# Patient Record
Sex: Male | Born: 1975
Health system: Southern US, Community
[De-identification: ages and names within clinical notes are randomized; demographics above are authoritative.]

## PROBLEM LIST (undated history)

## (undated) DIAGNOSIS — N200 Calculus of kidney: Secondary | ICD-10-CM

## (undated) DIAGNOSIS — K219 Gastro-esophageal reflux disease without esophagitis: Secondary | ICD-10-CM

## (undated) HISTORY — PX: HERNIA REPAIR: SHX51

## (undated) HISTORY — PX: TONSILLECTOMY: SUR1361

---

## 2016-06-21 DIAGNOSIS — J0100 Acute maxillary sinusitis, unspecified: Secondary | ICD-10-CM | POA: Diagnosis not present

## 2017-01-07 DIAGNOSIS — L739 Follicular disorder, unspecified: Secondary | ICD-10-CM | POA: Diagnosis not present

## 2017-01-07 DIAGNOSIS — J3089 Other allergic rhinitis: Secondary | ICD-10-CM | POA: Diagnosis not present

## 2017-06-14 DIAGNOSIS — M25571 Pain in right ankle and joints of right foot: Secondary | ICD-10-CM | POA: Diagnosis not present

## 2017-12-01 DIAGNOSIS — R5383 Other fatigue: Secondary | ICD-10-CM | POA: Diagnosis not present

## 2017-12-01 DIAGNOSIS — R4789 Other speech disturbances: Secondary | ICD-10-CM | POA: Diagnosis not present

## 2017-12-01 DIAGNOSIS — Z720 Tobacco use: Secondary | ICD-10-CM | POA: Diagnosis not present

## 2017-12-01 DIAGNOSIS — R0683 Snoring: Secondary | ICD-10-CM | POA: Diagnosis not present

## 2017-12-07 DIAGNOSIS — R4789 Other speech disturbances: Secondary | ICD-10-CM | POA: Diagnosis not present

## 2018-02-01 DIAGNOSIS — G4719 Other hypersomnia: Secondary | ICD-10-CM | POA: Diagnosis not present

## 2018-02-21 DIAGNOSIS — G4733 Obstructive sleep apnea (adult) (pediatric): Secondary | ICD-10-CM | POA: Diagnosis not present

## 2018-03-01 DIAGNOSIS — G4733 Obstructive sleep apnea (adult) (pediatric): Secondary | ICD-10-CM | POA: Diagnosis not present

## 2018-04-01 DIAGNOSIS — G4733 Obstructive sleep apnea (adult) (pediatric): Secondary | ICD-10-CM | POA: Diagnosis not present

## 2018-04-07 DIAGNOSIS — G4733 Obstructive sleep apnea (adult) (pediatric): Secondary | ICD-10-CM | POA: Diagnosis not present

## 2018-04-18 DIAGNOSIS — G4733 Obstructive sleep apnea (adult) (pediatric): Secondary | ICD-10-CM | POA: Diagnosis not present

## 2018-05-02 DIAGNOSIS — G4733 Obstructive sleep apnea (adult) (pediatric): Secondary | ICD-10-CM | POA: Diagnosis not present

## 2018-05-21 DIAGNOSIS — Z23 Encounter for immunization: Secondary | ICD-10-CM | POA: Diagnosis not present

## 2018-05-30 DIAGNOSIS — G4733 Obstructive sleep apnea (adult) (pediatric): Secondary | ICD-10-CM | POA: Diagnosis not present

## 2018-05-30 DIAGNOSIS — G4719 Other hypersomnia: Secondary | ICD-10-CM | POA: Diagnosis not present

## 2018-06-06 DIAGNOSIS — G4733 Obstructive sleep apnea (adult) (pediatric): Secondary | ICD-10-CM | POA: Diagnosis not present

## 2018-07-02 DIAGNOSIS — G4733 Obstructive sleep apnea (adult) (pediatric): Secondary | ICD-10-CM | POA: Diagnosis not present

## 2018-08-01 DIAGNOSIS — G4733 Obstructive sleep apnea (adult) (pediatric): Secondary | ICD-10-CM | POA: Diagnosis not present

## 2018-09-01 DIAGNOSIS — G4733 Obstructive sleep apnea (adult) (pediatric): Secondary | ICD-10-CM | POA: Diagnosis not present

## 2018-09-08 DIAGNOSIS — G4733 Obstructive sleep apnea (adult) (pediatric): Secondary | ICD-10-CM | POA: Diagnosis not present

## 2018-10-01 DIAGNOSIS — G4733 Obstructive sleep apnea (adult) (pediatric): Secondary | ICD-10-CM | POA: Diagnosis not present

## 2018-10-11 ENCOUNTER — Other Ambulatory Visit (HOSPITAL_BASED_OUTPATIENT_CLINIC_OR_DEPARTMENT_OTHER): Payer: Self-pay

## 2018-10-11 DIAGNOSIS — G4710 Hypersomnia, unspecified: Secondary | ICD-10-CM

## 2018-10-31 DIAGNOSIS — G4733 Obstructive sleep apnea (adult) (pediatric): Secondary | ICD-10-CM | POA: Diagnosis not present

## 2018-12-01 DIAGNOSIS — G4733 Obstructive sleep apnea (adult) (pediatric): Secondary | ICD-10-CM | POA: Diagnosis not present

## 2018-12-04 ENCOUNTER — Encounter (HOSPITAL_BASED_OUTPATIENT_CLINIC_OR_DEPARTMENT_OTHER): Payer: BLUE CROSS/BLUE SHIELD

## 2018-12-05 ENCOUNTER — Encounter (HOSPITAL_BASED_OUTPATIENT_CLINIC_OR_DEPARTMENT_OTHER): Payer: BLUE CROSS/BLUE SHIELD

## 2019-01-25 DIAGNOSIS — G4733 Obstructive sleep apnea (adult) (pediatric): Secondary | ICD-10-CM | POA: Diagnosis not present

## 2019-02-21 ENCOUNTER — Other Ambulatory Visit (HOSPITAL_BASED_OUTPATIENT_CLINIC_OR_DEPARTMENT_OTHER): Payer: Self-pay

## 2019-02-21 DIAGNOSIS — G4710 Hypersomnia, unspecified: Secondary | ICD-10-CM

## 2019-02-23 ENCOUNTER — Other Ambulatory Visit (HOSPITAL_COMMUNITY)
Admission: RE | Admit: 2019-02-23 | Discharge: 2019-02-23 | Disposition: A | Discharge status: Home / Self Care | Payer: BC Managed Care – PPO | Source: Ambulatory Visit | Attending: Internal Medicine | Admitting: Internal Medicine

## 2019-02-23 DIAGNOSIS — Z1159 Encounter for screening for other viral diseases: Secondary | ICD-10-CM | POA: Diagnosis not present

## 2019-02-23 LAB — SARS CORONAVIRUS 2 (TAT 6-24 HRS): SARS Coronavirus 2: NEGATIVE

## 2019-02-24 DIAGNOSIS — G4733 Obstructive sleep apnea (adult) (pediatric): Secondary | ICD-10-CM | POA: Diagnosis not present

## 2019-02-26 ENCOUNTER — Encounter (HOSPITAL_BASED_OUTPATIENT_CLINIC_OR_DEPARTMENT_OTHER): Payer: BLUE CROSS/BLUE SHIELD

## 2019-02-26 ENCOUNTER — Other Ambulatory Visit: Payer: Self-pay

## 2019-02-26 ENCOUNTER — Ambulatory Visit (HOSPITAL_BASED_OUTPATIENT_CLINIC_OR_DEPARTMENT_OTHER): Payer: BC Managed Care – PPO | Attending: Internal Medicine | Admitting: Internal Medicine

## 2019-02-26 DIAGNOSIS — G4733 Obstructive sleep apnea (adult) (pediatric): Secondary | ICD-10-CM | POA: Diagnosis not present

## 2019-02-26 DIAGNOSIS — G4710 Hypersomnia, unspecified: Secondary | ICD-10-CM

## 2019-02-27 ENCOUNTER — Ambulatory Visit (HOSPITAL_BASED_OUTPATIENT_CLINIC_OR_DEPARTMENT_OTHER): Payer: BC Managed Care – PPO | Attending: Internal Medicine | Admitting: Internal Medicine

## 2019-02-27 DIAGNOSIS — G4710 Hypersomnia, unspecified: Secondary | ICD-10-CM | POA: Diagnosis not present

## 2019-02-27 DIAGNOSIS — G4711 Idiopathic hypersomnia with long sleep time: Secondary | ICD-10-CM | POA: Diagnosis not present

## 2019-02-27 DIAGNOSIS — G4733 Obstructive sleep apnea (adult) (pediatric): Secondary | ICD-10-CM | POA: Diagnosis not present

## 2019-03-01 DIAGNOSIS — G4711 Idiopathic hypersomnia with long sleep time: Secondary | ICD-10-CM | POA: Diagnosis not present

## 2019-03-01 DIAGNOSIS — G4733 Obstructive sleep apnea (adult) (pediatric): Secondary | ICD-10-CM | POA: Diagnosis not present

## 2019-03-01 NOTE — Procedures (Signed)
   NAME: Alexander Rhodes DATE OF BIRTH:  09-19-1975 MEDICAL RECORD NUMBER [id number]  LOCATION: [facility]  PHYSICIAN: Marius Ditch  DATE OF STUDY: 02/26/2019  SLEEP STUDY TYPE: Polysomnogram with Positive Airway Pressure Titration               REFERRING PHYSICIAN: Marius Ditch, MD  INDICATION FOR STUDY: well controlled OSA on CPAP with severe persistent daytime sleepiness  EPWORTH SLEEPINESS SCORE:  21-24 HEIGHT: 5\' 11"  (180.3 cm)  WEIGHT: 180 lb (81.6 kg)    Body mass index is 25.1 kg/m.  NECK SIZE: 16 in.  MEDICATIONS  Patient self administered medications include: N/A. Medications administered during study include No sleep medicine administered.Marland Kitchen   SLEEP STUDY TECHNIQUE  The patient underwent an attended overnight polysomnography titration to assess the effects of cpap therapy. The following variables were monitored: EEG(C4-A1, C3-A2, O1-A2, O2-A1), EOG, submental and leg EMG, ECG, oxyhemoglobin saturation by pulse oximetry, thoracic and abdominal respiratory effort belts, nasal/oral airflow by pressure sensor, body position sensor and snoring sensor. CPAP pressure was titrated to eliminate apneas, hypopneas and oxygen desaturation.   TECHNICAL COMMENTS  Comments added by Technician: RESMED AIR-FIT F20 MEDIUM MASK WAS USED FOR THIS STUDY Comments added by Scorer: N/A   SLEEP ARCHITECTURE  The study was initiated at 10:03:30 PM and terminated at 6:05:54 AM. Total recorded time was 482.4 minutes. EEG confirmed total sleep time was 415 minutes yielding a sleep efficiency of 86.0%%. Sleep onset after lights out was 25.9 minutes with a REM latency of 226.5 minutes. The patient spent 5.8%% of the night in stage N1 sleep, 73.9%% in stage N2 sleep, 0.0%% in stage N3 and 20.4% in REM. The Arousal Index was 2.3/hour.   RESPIRATORY PARAMETERS  The overall AHI was 0.4 per hour, and the RDI was 0.4 events/hour with a central apnea index of 0.0per hour. The most  appropriate setting of CPAP was IPAP/EPAP 11/11 cm H2O. At this setting, the sleep efficiency was 95% and the patient was supine for 51%. The AHI was 0.5 events per hour, and the RDI was 0.5 events/hour (with 0.0 central events) and the arousal index was 0.5 per hour.The oxygen nadir was 93.0% during sleep.   LEG MOVEMENT DATA  The total leg movements were 0 with a resulting leg movement index of 0.0/hr. Associated arousal with leg movement index was 0.0/hr.   CARDIAC DATA  The underlying cardiac rhythm was most consistent with sinus rhythm. Mean heart rate during sleep was 53.4 bpm. Additional rhythm abnormalities include None.   IMPRESSIONS  Obstructive Sleep apnea(OSA) Optimal pressure attained. No Significant Central Sleep Apnea (CSA) No significant periodic leg movements(PLMs) during sleep.   DIAGNOSIS  Obstructive Sleep Apnea (327.23 [G47.33 ICD-10])  RECOMMENDATIONS  Patient may proceed with MSLT  Marius Ditch Sleep specialist, American Board of Internal Medicine  ELECTRONICALLY SIGNED ON:  03/01/2019, 8:15 PM [place] PH: [phone]   FX: [phone] [place]

## 2019-03-01 NOTE — Procedures (Signed)
    NAME: Alexander Rhodes DATE OF BIRTH:  02-19-1976 MEDICAL RECORD NUMBER [id number]  LOCATION: [facility]  PHYSICIAN: Marius Ditch  DATE OF STUDY: 02/27/2019  SLEEP STUDY TYPE: Multiple Sleep Latency Test               REFERRING PHYSICIAN: Marius Ditch, MD  INDICATION FOR STUDY: Severe daytime sleepiness with well controlled obstructive sleep apnea  EPWORTH SLEEPINESS SCORE:  24 HEIGHT: 5\' 11"  (180.3 cm)  WEIGHT: 180 lb (81.6 kg)    Body mass index is 25.1 kg/m.  NECK SIZE: 16 in.  MEDICATIONS  Patient self administered medications include: N/A. Medications administered during study include No sleep medicine administered.Marland Kitchen   SLEEP STUDY TECHNIQUE  A multiple sleep latency test was performed. The channels recorded and monitored were central and occipital EEG, electrooculogram (EOG), submentalis EMG (chin), and electrocardiogram.   TECHNICAL COMMENTS  Comments added by Technician: NONE Comments added by Scorer: N/A   IMPRESSIONS  Pathologic sleepiness was evidenced by short mean sleep latency. Average sleep onset latency 4:28 No sleep onset REMs present. This study does not suggest narcolepsy. Total number of naps attempted: 5 . Total number of naps with sleep attained: 5 It is noted that although patient fell asleep quickly in the naps, sleep was not well sustained. The meaning of this is unclear.   DIAGNOSIS  Idiopathic hypersomnia (327.11 [G47.11 ICD-10])  RECOMMENDATIONS  Return for follow up and management of Idiopathic Hypersomnia.   Marius Ditch Sleep specialist, [employer]  ELECTRONICALLY SIGNED ON:  03/01/2019, 8:37 PM [place] PH: [phone]   FX: [phone] [place]

## 2019-03-06 DIAGNOSIS — G4712 Idiopathic hypersomnia without long sleep time: Secondary | ICD-10-CM | POA: Diagnosis not present

## 2019-03-06 DIAGNOSIS — G4733 Obstructive sleep apnea (adult) (pediatric): Secondary | ICD-10-CM | POA: Diagnosis not present

## 2019-03-27 DIAGNOSIS — G4733 Obstructive sleep apnea (adult) (pediatric): Secondary | ICD-10-CM | POA: Diagnosis not present

## 2019-04-26 DIAGNOSIS — G4712 Idiopathic hypersomnia without long sleep time: Secondary | ICD-10-CM | POA: Diagnosis not present

## 2019-05-29 DIAGNOSIS — G4733 Obstructive sleep apnea (adult) (pediatric): Secondary | ICD-10-CM | POA: Diagnosis not present

## 2019-07-03 DIAGNOSIS — G4733 Obstructive sleep apnea (adult) (pediatric): Secondary | ICD-10-CM | POA: Diagnosis not present

## 2019-10-02 DIAGNOSIS — G4733 Obstructive sleep apnea (adult) (pediatric): Secondary | ICD-10-CM | POA: Diagnosis not present

## 2019-10-09 ENCOUNTER — Ambulatory Visit: Payer: BC Managed Care – PPO | Attending: Internal Medicine

## 2019-10-09 DIAGNOSIS — Z23 Encounter for immunization: Secondary | ICD-10-CM | POA: Insufficient documentation

## 2019-10-09 NOTE — Progress Notes (Signed)
   Covid-19 Vaccination Clinic  Name:  Alexander Rhodes    MRN: [id number] DOB: [birth year]  10/09/2019  [name] was observed post Covid-19 immunization for 15 minutes without incident. He was provided with Vaccine Information Sheet and instruction to access the V-Safe system.   [name] was instructed to call 911 with any severe reactions post vaccine: Marland Kitchen Difficulty breathing  . Swelling of face and throat  . A fast heartbeat  . A bad rash all over body  . Dizziness and weakness   Immunizations Administered    Name Date Dose VIS Date Route   Pfizer COVID-19 Vaccine 10/09/2019  8:23 AM 0.3 mL 07/14/2019 Intramuscular   Manufacturer: [employer], [employer]   Lot: [id number]   NDC: [address]-1

## 2019-10-25 DIAGNOSIS — G4712 Idiopathic hypersomnia without long sleep time: Secondary | ICD-10-CM | POA: Diagnosis not present

## 2019-10-25 DIAGNOSIS — G4733 Obstructive sleep apnea (adult) (pediatric): Secondary | ICD-10-CM | POA: Diagnosis not present

## 2019-11-08 ENCOUNTER — Ambulatory Visit: Payer: BC Managed Care – PPO | Attending: Internal Medicine

## 2019-11-08 DIAGNOSIS — Z23 Encounter for immunization: Secondary | ICD-10-CM

## 2019-11-08 NOTE — Progress Notes (Signed)
   Covid-19 Vaccination Clinic  Name:  Alexander Rhodes    MRN: [id number] DOB: [birth year]  11/08/2019  [name] was observed post Covid-19 immunization for 15 minutes without incident. He was provided with Vaccine Information Sheet and instruction to access the V-Safe system.   [name] was instructed to call 911 with any severe reactions post vaccine: Marland Kitchen Difficulty breathing  . Swelling of face and throat  . A fast heartbeat  . A bad rash all over body  . Dizziness and weakness   Immunizations Administered    Name Date Dose VIS Date Route   Pfizer COVID-19 Vaccine 11/08/2019 10:09 AM 0.3 mL 07/14/2019 Intramuscular   Manufacturer: [employer], [employer]   Lot: [id number]   NDC: [address]-6

## 2020-01-01 DIAGNOSIS — G4733 Obstructive sleep apnea (adult) (pediatric): Secondary | ICD-10-CM | POA: Diagnosis not present

## 2020-04-01 DIAGNOSIS — G4733 Obstructive sleep apnea (adult) (pediatric): Secondary | ICD-10-CM | POA: Diagnosis not present

## 2020-04-22 DIAGNOSIS — G4712 Idiopathic hypersomnia without long sleep time: Secondary | ICD-10-CM | POA: Diagnosis not present

## 2020-07-01 DIAGNOSIS — G4733 Obstructive sleep apnea (adult) (pediatric): Secondary | ICD-10-CM | POA: Diagnosis not present

## 2020-09-10 DIAGNOSIS — R413 Other amnesia: Secondary | ICD-10-CM | POA: Diagnosis not present

## 2020-09-10 DIAGNOSIS — G4712 Idiopathic hypersomnia without long sleep time: Secondary | ICD-10-CM | POA: Diagnosis not present

## 2020-09-10 DIAGNOSIS — Z23 Encounter for immunization: Secondary | ICD-10-CM | POA: Diagnosis not present

## 2020-09-10 DIAGNOSIS — Z720 Tobacco use: Secondary | ICD-10-CM | POA: Diagnosis not present

## 2020-09-10 DIAGNOSIS — G4733 Obstructive sleep apnea (adult) (pediatric): Secondary | ICD-10-CM | POA: Diagnosis not present

## 2020-09-10 DIAGNOSIS — Z13220 Encounter for screening for lipoid disorders: Secondary | ICD-10-CM | POA: Diagnosis not present

## 2020-09-10 DIAGNOSIS — Z0000 Encounter for general adult medical examination without abnormal findings: Secondary | ICD-10-CM | POA: Diagnosis not present

## 2020-09-30 DIAGNOSIS — G4733 Obstructive sleep apnea (adult) (pediatric): Secondary | ICD-10-CM | POA: Diagnosis not present

## 2020-10-09 DIAGNOSIS — M25512 Pain in left shoulder: Secondary | ICD-10-CM | POA: Diagnosis not present

## 2020-10-10 ENCOUNTER — Emergency Department (HOSPITAL_COMMUNITY): Payer: BC Managed Care – PPO

## 2020-10-10 ENCOUNTER — Emergency Department (HOSPITAL_COMMUNITY)
Admission: EM | Admit: 2020-10-10 | Discharge: 2020-10-10 | Disposition: A | Discharge status: Home / Self Care | Payer: BC Managed Care – PPO | Attending: Emergency Medicine | Admitting: Emergency Medicine

## 2020-10-10 ENCOUNTER — Encounter (HOSPITAL_COMMUNITY): Payer: Self-pay

## 2020-10-10 DIAGNOSIS — R001 Bradycardia, unspecified: Secondary | ICD-10-CM | POA: Diagnosis not present

## 2020-10-10 DIAGNOSIS — F17200 Nicotine dependence, unspecified, uncomplicated: Secondary | ICD-10-CM | POA: Insufficient documentation

## 2020-10-10 DIAGNOSIS — R109 Unspecified abdominal pain: Secondary | ICD-10-CM | POA: Insufficient documentation

## 2020-10-10 DIAGNOSIS — K219 Gastro-esophageal reflux disease without esophagitis: Secondary | ICD-10-CM | POA: Diagnosis not present

## 2020-10-10 DIAGNOSIS — N3289 Other specified disorders of bladder: Secondary | ICD-10-CM | POA: Diagnosis not present

## 2020-10-10 DIAGNOSIS — X58XXXA Exposure to other specified factors, initial encounter: Secondary | ICD-10-CM | POA: Insufficient documentation

## 2020-10-10 DIAGNOSIS — S39012A Strain of muscle, fascia and tendon of lower back, initial encounter: Secondary | ICD-10-CM | POA: Diagnosis not present

## 2020-10-10 DIAGNOSIS — S3992XA Unspecified injury of lower back, initial encounter: Secondary | ICD-10-CM | POA: Diagnosis not present

## 2020-10-10 DIAGNOSIS — S39012D Strain of muscle, fascia and tendon of lower back, subsequent encounter: Secondary | ICD-10-CM | POA: Diagnosis not present

## 2020-10-10 HISTORY — DX: Gastro-esophageal reflux disease without esophagitis: K21.9

## 2020-10-10 HISTORY — DX: Calculus of kidney: N20.0

## 2020-10-10 LAB — URINALYSIS, ROUTINE W REFLEX MICROSCOPIC
Bilirubin Urine: NEGATIVE
Glucose, UA: NEGATIVE mg/dL
Hgb urine dipstick: NEGATIVE
Ketones, ur: NEGATIVE mg/dL
Leukocytes,Ua: NEGATIVE
Nitrite: NEGATIVE
Protein, ur: NEGATIVE mg/dL
Specific Gravity, Urine: 1.008 (ref 1.005–1.030)
pH: 6 (ref 5.0–8.0)

## 2020-10-10 LAB — COMPREHENSIVE METABOLIC PANEL
ALT: 21 U/L (ref 0–44)
AST: 16 U/L (ref 15–41)
Albumin: 4 g/dL (ref 3.5–5.0)
Alkaline Phosphatase: 54 U/L (ref 38–126)
Anion gap: 6 (ref 5–15)
BUN: 12 mg/dL (ref 6–20)
CO2: 26 mmol/L (ref 22–32)
Calcium: 9.3 mg/dL (ref 8.9–10.3)
Chloride: 105 mmol/L (ref 98–111)
Creatinine, Ser: 0.8 mg/dL (ref 0.61–1.24)
GFR, Estimated: >60 mL/min (ref >60)
Glucose, Bld: 96 mg/dL (ref 70–99)
Potassium: 4 mmol/L (ref 3.5–5.1)
Sodium: 137 mmol/L (ref 135–145)
Total Bilirubin: 0.7 mg/dL (ref 0.3–1.2)
Total Protein: 6.6 g/dL (ref 6.5–8.1)

## 2020-10-10 LAB — CBC WITH DIFFERENTIAL/PLATELET
Abs Immature Granulocytes: 0.01 10*3/uL (ref 0.00–0.07)
Basophils Absolute: 0.1 10*3/uL (ref 0.0–0.1)
Basophils Relative: 1 %
Eosinophils Absolute: 0.1 10*3/uL (ref 0.0–0.5)
Eosinophils Relative: 2 %
HCT: 41 % (ref 39.0–52.0)
Hemoglobin: 13.8 g/dL (ref 13.0–17.0)
Immature Granulocytes: 0 %
Lymphocytes Relative: 18 %
Lymphs Abs: 1.3 10*3/uL (ref 0.7–4.0)
MCH: 31.2 pg (ref 26.0–34.0)
MCHC: 33.7 g/dL (ref 30.0–36.0)
MCV: 92.6 fL (ref 80.0–100.0)
Monocytes Absolute: 0.6 10*3/uL (ref 0.1–1.0)
Monocytes Relative: 9 %
Neutro Abs: 5.1 10*3/uL (ref 1.7–7.7)
Neutrophils Relative %: 70 %
Platelets: 186 10*3/uL (ref 150–400)
RBC: 4.43 MIL/uL (ref 4.22–5.81)
RDW: 12.2 % (ref 11.5–15.5)
WBC: 7.2 10*3/uL (ref 4.0–10.5)
nRBC: 0 % (ref 0.0–0.2)

## 2020-10-10 LAB — LIPASE, BLOOD: Lipase: 24 U/L (ref 11–51)

## 2020-10-10 MED ORDER — IBUPROFEN 600 MG PO TABS: 600.0000 mg | ORAL_TABLET | Freq: Four times a day (QID) | ORAL | 0 refills | Status: AC | PRN

## 2020-10-10 MED ORDER — KETOROLAC TROMETHAMINE 15 MG/ML IJ SOLN
15.0000 mg | Freq: Once | INTRAMUSCULAR | Status: AC
  Administered 2020-10-10: 15 mg via INTRAMUSCULAR
  Filled 2020-10-10: qty 1

## 2020-10-10 MED ORDER — PREDNISONE 20 MG PO TABS: 40.0000 mg | ORAL_TABLET | Freq: Every day | ORAL | 0 refills | Status: AC

## 2020-10-10 NOTE — ED Provider Notes (Signed)
[facility]-EMERGENCY DEPT Provider Note   CSN: [id number] Arrival date & time: 10/10/20  0730     History Chief Complaint  Patient presents with  . Flank Pain    Alexander Rhodes is a 45 y.o. male.  45 y.o male [place] PMH of GERD presents to the ED with a chief complaint of left flank pain x 6 days.  Patient states his symptoms began with a sharp, constant pain that began at his left flank described as tightness worse with ambulation along with movement.  He reports he is unable to sleep on his sides, has had to sleep on his back.  He was evaluated at urgent care yesterday, had an EKG, x-ray which were within normal limits.  He was prescribed muscle relaxers which she reports taking have not helped.  Today he endorses getting out of bed, felt like he was unable to take his first step due to pain.  Does have a prior history of kidney stones, but this feels different, feels that the pain is somewhat lower.  Does have a prior history of narrow repair.  No fever, bowel or bladder complaints, urinary symptoms, nausea vomiting or diarrhea.    The history is provided by the patient.       Past Medical History:  Diagnosis Date  . GERD (gastroesophageal reflux disease)   . Kidney stone     There are no problems to display for this patient.   Past Surgical History:  Procedure Laterality Date  . HERNIA REPAIR    . TONSILLECTOMY         History reviewed. No pertinent family history.  Social History   Tobacco Use  . Smoking status: Current Every Day Smoker  . Smokeless tobacco: Never Used  Substance Use Topics  . Alcohol use: Never  . Drug use: Never    Home Medications Prior to Admission medications   Medication Sig Start Date End Date Taking? Authorizing Provider  ibuprofen (ADVIL) 600 MG tablet Take 1 tablet (600 mg total) by mouth every 6 (six) hours as needed for up to 7 days. 10/10/20 10/17/20 Yes Almira Phetteplace, Leonie Douglas, PA-C  predniSONE (DELTASONE) 20 MG  tablet Take 2 tablets (40 mg total) by mouth daily for 5 days. 10/10/20 10/15/20 Yes Claude Manges, PA-C    Allergies    Patient has no known allergies.  Review of Systems   Review of Systems  Constitutional: Negative for fever.  Respiratory: Negative for shortness of breath.   Cardiovascular: Negative for chest pain.  Gastrointestinal: Negative for abdominal pain, diarrhea, nausea and vomiting.  Genitourinary: Positive for flank pain.  Musculoskeletal: Positive for back pain.  Skin: Negative for pallor and wound.  Neurological: Negative for light-headedness and headaches.  All other systems reviewed and are negative.   Physical Exam Updated Vital Signs BP 124/82 (BP Location: Right Arm)   Pulse (!) 57   Temp 98 F (36.7 C) (Oral)   Resp 15   Ht 5\' 11"  (1.803 m)   Wt 81.6 kg   SpO2 98%   BMI 25.10 kg/m   Physical Exam Vitals and nursing note reviewed.  Constitutional:      Appearance: Normal appearance.  HENT:     Head: Normocephalic and atraumatic.     Mouth/Throat:     Mouth: Mucous membranes are moist.  Eyes:     Pupils: Pupils are equal, round, and reactive to light.  Cardiovascular:     Rate and Rhythm: Normal rate.  Pulmonary:  Effort: Pulmonary effort is normal.  Abdominal:     General: Abdomen is flat.     Palpations: Abdomen is soft.     Tenderness: There is no abdominal tenderness. There is left CVA tenderness. There is no right CVA tenderness.  Musculoskeletal:     Cervical back: Normal range of motion and neck supple.  Skin:    General: Skin is warm and dry.  Neurological:     Mental Status: He is alert and oriented to person, place, and time.     ED Results / Procedures / Treatments   Labs (all labs ordered are listed, but only abnormal results are displayed) Labs Reviewed  URINALYSIS, ROUTINE W REFLEX MICROSCOPIC - Abnormal; Notable for the following components:      Result Value   Color, Urine STRAW (*)    All other components within  normal limits  COMPREHENSIVE METABOLIC PANEL  CBC WITH DIFFERENTIAL/PLATELET  LIPASE, BLOOD  CBC WITH DIFFERENTIAL/PLATELET    EKG None  Radiology CT Renal Stone Study  Result Date: 10/10/2020 CLINICAL DATA:  Left-sided flank pain EXAM: CT ABDOMEN AND PELVIS WITHOUT CONTRAST TECHNIQUE: Multidetector CT imaging of the abdomen and pelvis was performed following the standard protocol without IV contrast. COMPARISON:  None. FINDINGS: Lower chest: The visualized heart size within normal limits. No pericardial fluid/thickening. No hiatal hernia. The visualized portions of the lungs are clear. Hepatobiliary: Although limited due to the lack of intravenous contrast, normal in appearance without gross focal abnormality. No evidence of calcified gallstones or biliary ductal dilatation. Pancreas:  Unremarkable.  No surrounding inflammatory changes. Spleen: Normal in size. Although limited due to the lack of intravenous contrast, normal in appearance. Adrenals/Urinary Tract: Both adrenal glands appear normal. The kidneys and collecting system appear normal without evidence of urinary tract calculus or hydronephrosis. Bladder is fluid-filled and mildly dilated. Stomach/Bowel: The stomach, small bowel, and colon are normal in appearance. No inflammatory changes or obstructive findings. There is a moderate amount of colonic stool present throughout. Appendix is normal. Vascular/Lymphatic: There are no enlarged abdominal or pelvic lymph nodes. No significant gross vascular findings are present given the lack of intravenous contrast. Reproductive: The prostate is unremarkable. Other: No evidence of abdominal wall mass or hernia. Musculoskeletal: No acute or significant osseous findings. IMPRESSION: No renal or collecting system calculi. No other acute intra-abdominal or pelvic pathology to explain the patient's symptoms. Electronically Signed   By: Jonna Clark M.D.   On: 10/10/2020 13:35    Procedures Procedures    Medications Ordered in ED Medications  ketorolac (TORADOL) 15 MG/ML injection 15 mg (15 mg Intramuscular Given 10/10/20 1227)    ED Course  I have reviewed the triage vital signs and the nursing notes.  Pertinent labs & imaging results that were available during my care of the patient were reviewed by me and considered in my medical decision making (see chart for details).  Clinical Course as of 10/10/20 1449  Thu Oct 10, 2020  1428 Lipase: 24 [JS]    Clinical Course User Index [JS] Claude Manges, PA-C   MDM Rules/Calculators/A&P  Patient with no pertinent past medical history resents the ED with a chief complaint of left flank pain which began 6 days ago.  Evaluated at urgent care recently, given muscle relaxers to help with pain without any improvement.  Does have a prior history of kidney stone, reports this feels different.  Arrived in the ED with stable vital signs.  UA was collected while in triage, did not  show any nitrites, leukocytes or signs of infection.  Report the pain is exacerbated with ambulation, worsened this morning as he was trying to get out of bed.  However, this pain also occurs at rest.  Interpretation of his labs revealed a CBC without any leukocytosis, hemoglobin is within normal limits.  CMP without any electrolyte derangement, creatinine level is unremarkable.  LFTs are within normal limits.  Lipase level is normal.    Discussed these results with patient, he was evaluated yesterday at urgent care and diagnosed with a left shoulder pain and likely MSK.  He is currently denying any chest pain, shortness of breath, URI symptoms, have a lower suspicion for pulmonary involvement.  Patient did have a EKG with a chest x-ray yesterday which were normal in urgent care.  We did discuss prior history of kidney stones, some suspicion for likely residual spasms after stone passage.  Will obtain CT renal at this time.  CT Renal stone: No renal or collecting system calculi.     No other acute intra-abdominal or pelvic pathology to explain the  patient's symptoms.     His results were discussed with patient at length.  He was provided with Toradol which he reports did not help much with his pain.  Pain continues to wax and wane.  Vitals remained stable he is without any fever, nausea or vomiting.  Patient was also evaluated by my attending Dr. Myrtis Ser, patient does report the pain originated when he was moving while driving his car.  The pain does change with movement along with bending, higher suspicion for MSK involvement.  He does have a prescription for muscle relaxers, will provide him with anti-inflammatories along with steroids to help with his complaints.  No prior history of diabetes.  Strict return precautions discussed at length.  Patient stable for discharge.  Portions of this note were generated with Scientist, clinical (histocompatibility and immunogenetics). Dictation errors may occur despite best attempts at proofreading.  Final Clinical Impression(s) / ED Diagnoses Final diagnoses:  Back strain, subsequent encounter    Rx / DC Orders ED Discharge Orders         Ordered    ibuprofen (ADVIL) 600 MG tablet  Every 6 hours PRN        10/10/20 1443    predniSONE (DELTASONE) 20 MG tablet  Daily        10/10/20 1443           Claude Manges, PA-C 10/10/20 1449    Sabino Donovan, MD 10/11/20 [phone]

## 2020-10-10 NOTE — Discharge Instructions (Addendum)
We discussed the results of your labs along with CT imaging.  We suspect that your pain is originated from a muscle strain.  You may continue taking that muscle relaxers that were prescribed to you for muscle strain.  In addition you may take some ibuprofen 600 mg every 6 hours for pain as needed.  Please make sure these medications be taken with food.  I have also prescribed a short course of steroids, please take 2 tablets daily for the next 5 days.  Please be aware this medication can cause flushness, insomnia, appetite changes.  Follow-up with your primary care physician as needed.

## 2020-10-10 NOTE — ED Triage Notes (Signed)
Pt presents with c/o left side flank pain. Pt reports the pain initially started on Friday with a tensing of his muscles which did eventually release. Pt does report a hx of kidney stones.

## 2020-10-10 NOTE — ED Notes (Signed)
Patient has a urine culture in the main lab 

## 2020-10-24 DIAGNOSIS — R0781 Pleurodynia: Secondary | ICD-10-CM | POA: Diagnosis not present

## 2020-10-24 DIAGNOSIS — M546 Pain in thoracic spine: Secondary | ICD-10-CM | POA: Diagnosis not present

## 2020-10-29 ENCOUNTER — Ambulatory Visit: Payer: BC Managed Care – PPO

## 2020-10-29 ENCOUNTER — Ambulatory Visit (INDEPENDENT_AMBULATORY_CARE_PROVIDER_SITE_OTHER): Payer: BC Managed Care – PPO | Admitting: Counselor

## 2020-10-29 ENCOUNTER — Encounter: Payer: Self-pay | Admitting: Counselor

## 2020-10-29 ENCOUNTER — Other Ambulatory Visit: Payer: Self-pay

## 2020-10-29 DIAGNOSIS — Z9989 Dependence on other enabling machines and devices: Secondary | ICD-10-CM | POA: Diagnosis not present

## 2020-10-29 DIAGNOSIS — G3184 Mild cognitive impairment, so stated: Secondary | ICD-10-CM | POA: Diagnosis not present

## 2020-10-29 DIAGNOSIS — G4733 Obstructive sleep apnea (adult) (pediatric): Secondary | ICD-10-CM

## 2020-10-29 DIAGNOSIS — G4711 Idiopathic hypersomnia with long sleep time: Secondary | ICD-10-CM

## 2020-10-29 DIAGNOSIS — F09 Unspecified mental disorder due to known physiological condition: Secondary | ICD-10-CM

## 2020-10-29 DIAGNOSIS — M546 Pain in thoracic spine: Secondary | ICD-10-CM | POA: Diagnosis not present

## 2020-10-29 NOTE — Progress Notes (Deleted)
Subjective:    Patient ID: Alexander Rhodes is a 45 y.o. male.  HPI {Common ambulatory SmartLinks:19316}  Review of Systems  Objective:  Neurological Exam  Physical Exam  Assessment:   ***  Plan:   ***

## 2020-10-29 NOTE — Progress Notes (Signed)
   Psychometrist Note   Cognitive testing was administered to Alexander Rhodes by Lamar Benes, B.S. (Technician) under Alexander supervision of Alphonzo Severance, Psy.D., ABN. [name] was able to tolerate all test procedures. Dr. Nicole Kindred met with Alexander Rhodes as needed to manage any emotional reactions to Alexander testing procedures. Rest breaks were offered.    Alexander battery of tests administered was selected by Dr. Nicole Kindred with consideration to Alexander Rhodes's current level of functioning, Alexander nature of his symptoms, emotional and behavioral responses during Alexander interview, level of literacy, observed level of motivation/effort, and Alexander nature of Alexander referral question. This battery was communicated to Alexander psychometrist. Communication between Dr. Nicole Kindred and Alexander psychometrist was ongoing throughout Alexander evaluation and Dr. Nicole Kindred was immediately accessible at all times. Dr. Nicole Kindred provided supervision to Alexander technician on Alexander date of this service, to Alexander extent necessary to assure Alexander quality of all services provided.    Alexander Rhodes will return in approximately one week for an interactive feedback session with Dr. Nicole Kindred, at which time test performance, clinical impressions, and treatment recommendations will be reviewed in detail. Alexander Rhodes understands he can contact our office should he require our assistance before this time.   A total of 160 minutes of billable time were spent with Alexander Rhodes by Alexander technician, including test administration and scoring time. Billing for these services is reflected in Dr. Les Pou note.   This note reflects time spent with Alexander psychometrician and does not include test scores, clinical history, or any interpretations made by Dr. Nicole Kindred. Alexander full report will follow in a separate note.

## 2020-10-29 NOTE — Progress Notes (Signed)
NEUROPSYCHOLOGICAL TEST SCORES [facility]  Patient Name: Alexander Rhodes MRN: [id number] Date of Birth: [birth year] Age: 45 y.o. Education: 18 years  Measurement properties of test scores: IQ, Index, and Standard Scores (SS): Mean = 100; Standard Deviation = 15 Scaled Scores (Ss): Mean = 10; Standard Deviation = 3 Z scores (Z): Mean = 0; Standard Deviation = 1 T scores (T); Mean = 50; Standard Deviation = 10  TEST SCORES:    Note: This summary of test scores accompanies the interpretive report and should not be interpreted by unqualified individuals or in isolation without reference to the report. Test scores are relative to age, gender, and educational history as available and appropriate.   Performance Validity        ACS: Raw  Descriptor      Word Choice 49 Within Expectation       Raw  Descriptor  The Dot Counting Test: 11 Within Expectation      Embedded Measures: Raw  Descriptor      RBANS Effort Index 12 Below Expectation      NAB Effort Index 2 Within Expectation      Mental Status Screening     Total Score Descriptor  MoCA 24 Normal      Expected Functioning        Wide Range Achievement Test: Standard/Scaled Score Percentile      Word Reading 87 19      Reynolds Intellectual Screening Test Standard/T-score Percentile      Guess What 56 73      Odd Item Out 54 66  RIST Index 109 73      Attention/Processing Speed        Neuropsychological Assessment Battery (Attention Module, Form 1): Scaled/T-score Percentile  Attention Index (ATT): 59 <1      Dots 51 54      Digits Forward 28 2      Digits Backwards 47 38      Numbers & Letters A Efficiency 21 <1      Numbers & Letters B Efficiency 34 5      Numbers & Letters C Efficiency 30 2      Numbers & Letters D Efficiency 26 1      Numbers & Letters Part D Disruption 49 46      Driving Scenes 39 14      Language        Neuropsychological Assessment Battery (Language Module, Form 1): T-score  Percentile      Naming   (31) 52 58      Verbal Fluency:  T Score Percentile      Controlled Oral Word Association (F-A-S) 22 <1      Semantic Fluency (Animals) 42 21      Memory:        Neuropsychological Assessment Battery (Memory Module, Form 1): T-score/Standard Score Percentile  Memory Index (MEM): 87 19      List Learning           List A Immediate Recall   (7 , 8 , 12) 51 54         List B Immediate Recall   (8) 63 91         List A Short Delayed Recall   (6) 36 8         List A Long Delayed Recall   (7) 42 21         List A Percent Retention   (117 %) ---  75         List A Long Delayed Yes/No Recognition Hits   (9) --- 5         List A Long Delayed Yes/No Recognition False Alarms   (1) --- 69         List A Recognition Discriminability Index --- 42      Shape Learning           Immediate Recognition   (4 , 5 , 8) 46 34         Delayed Recognition   (6) 44 27         Percent Retention   (75 %) --- 16         Delayed Forced-Choice Recognition Hits   (9) --- 84         Delayed Forced-Choice Recognition False Alarms   (0) --- 69         Delayed Forced-Choice Recognition Discriminability --- 84      Story Learning           Immediate Recall   (27, 36) 47 38         Delayed Recall   (34) 48 42         Percent Retention   (94 %) --- 46      Daily Living Memory            Immediate Recall   (25, 17) 43 25          Delayed Recall   (9, 5) 40 16          Percent Retention (82 %) --- 18          Recognition Hits   (10) --- 75      Visuospatial/Constructional Functioning        Neuropsychological Assessment Battery (Visuospatial Module) T-score Percentile      Visual Discrimination 57 75      Design Construction 58 79      Executive Functioning        Modified [place] Card Sorting Test (MWCST): Standard/T-Score Percentile      Number of Categories Correct 52 58      Number of Perseverative Errors 47 38      Number of Total Errors 54 66      Percent Perseverative Errors  41 18  Executive Function Composite 99 47          Trail Making Test: T-Score Percentile      Part A 32 4      Part B 28 2      Clock Drawing Raw Score Descriptor      Command 9 WNL      Rating Scales         Raw Score Descriptor  Patient Health Questionnaire - 9 5 Mild  GAD-7 2 Within Normal Limits   Elayah Klooster V. Roseanne Reno PsyD, ABN Clinical Neuropsychologist

## 2020-10-29 NOTE — Progress Notes (Signed)
NEUROPSYCHOLOGICAL EVALUATION  Neurology  Patient Name: Alexander Rhodes MRN: [id number] Date of Birth: 1975/11/26 Age: 45 y.o. Education: 18 years  Referral Circumstances and Background Information  [name] is a 44 y.o., right-hand dominant, married man with a history of OSA (on CPAP), excessive daytime sleepiness, and concerns about memory and word finding problems. Review of the referring providers notes shows the patient to have complained of difficulties with language and memory since around 2019, he was worked up and was found to have severe OSA but treatment with CPAP has not relieved his symptoms. He was referred by Alexander Hazel, MD with Alexander Rhodes at [facility].   On interview, the patient reported that beginning in mid 2019, he realized that he could not remember the past 8 months. He went to his PCP Dr. Hyacinth Rhodes who completed an MRI of the brain, which was normal, and he consulted with sleep medicine who diagnosed him with severe OSA. His difficulties slowly improved and he estimated that he has improved to 90% of his prior cognitive capacity, but it took nearly 2.5 years and he still has some residual difficulties. He would like to make sure that nothing is being missed and would appreciate a second opinion. In terms of his initial symptoms (all noticed around July 2019) he felt as though his processing speed was slow and it was taking him longer to put thoughts together. He was typing words backwards, at times, although that only happened 3 or 4 times. He also felt like he was speaking more slowly. He appreciated some difficulties picturing words that people say (although he still knew what the objects were). For instance if someone said "pineapple," he couldn't picture what a pineapple looks like but he did know what it was. It did not sound as though there was any frank loss of object knowledge. He also appreciated some difficulty with people's names and with word  finding. He denied that there were any changes in his functioning during the period of time he was most symptomatic or the period of time he did not recall. He continued to go to work, manage finances, drive, and do all the other things to which he was typically accustomed. He thinks that his coworkers did notice his problems, "they would give me a look" but did not say anything and his wife also commented on his problems.  As above, he thinks that his issues have gotten better over time, and he feels near normal at present. He will still have occasional word finding problems or cognitive sluggishness but it is nowhere near what it was in the past.    With respect to mood, the patient reported that he feels like he has been somewhat irritable over the past 7 or 8 years but he is better since getting on modafinil, which was prescribed by his sleep medicine doctor for wakefulness. He feels as though he gets pleasure out of life and that he has things he enjoys. He feels like his energy is fairly good and his sleep is significantly better, although he does have idiopathic hypersomnia and will have episodic tiredness throughout the day. He estimated he is getting around 7 hours a night of sleep and that he now sleeps through the night. He reported that his appetite is good and his weight is stable. With respect to functioning, the patient is still working and reported that he is performing adequately on the job. 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 His job involves monitoring the quality of products they produce and accounting for any discrepancies, it is very detail oriented. He has a Haematologiststaff of three who work under him and he denied that he is having any problems fulfilling his role responsibilities. He has not noticed any significant changes in driving, medication  management, scheduling appointments, cooking or doing things around the house or the like.   Past Medical History and Review of Relevant Studies  There are no problems to display for this patient.   Review of Neuroimaging and Relevant Medical History: I see a mention that the patient had MRI brain 05/19, which was normal, although I did not see the report or images in our PACS system. He says he has the image on disc and will bring it to the next appointment.   The patient denied any history of significant head injuries, strokes, seizures, or neurological surgeries.   No current outpatient medications on file.   No current facility-administered medications for this visit.    No family history on file.  There is a family history of dementia. His maternal and paternal grandmothers developed dementia. His maternal grandmother died at 6794 and developed it much later in life. His paternal grandmother died at 7568 and he isn't sure when she became symptomatic but thinks it was younger. His mother and father are both still living (3867 and 1968) and are cognitively healthy. There is no  family history of psychiatric illness.  Psychosocial History  Developmental, Educational and Employment History: [place] He reported that he was essentially raised by his grandparents, because his parents got divorced and his mother worked odd shifts. He denied any history of abuse or neglect. He reported that in school, he was not a good student, and "struggled." He reported that he had a hard time learning to read and write and that kept him behind. He did much better in math and science. He also reported that his family did not have an orientation towards education. He reported that he was tracked to vocational school and was involved in [employer] during high school. It does not sound as though he had a formal IEP although my understanding is that resources in his school  district were limited. He joined the [employer] because he wanted to go to college but could not afford it, and then started a bachelor's at [employer] in [employer]. He worked for about 5 years then returned to [employer] to pursue an Set designerMBA. 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   Psychiatric History: Any history of significant psychiatric symptoms or treatment was denied by the patient.   Substance Use History: Recent note of Alexander HazelLisa Miller indicates that the patient is drinking about 3-4 glasses of wine per day. On interview, the patient reported that he drinks around 14 drinks a week presently. Previously, he was drinking more, up to a bottle of wine a night. He decided to cut back 1.5 years ago. He thinks he was self-medicating his sleep difficulties. He smokes 3-4 cigarettes a day.   Relationship History and Living Cimcumstances: The patient has been married for 14 years, he has three children ages 516 - 212 who live with  him and his wife.   Mental Status and Behavioral Observations  Sensorium/Arousal: The patient's level of arousal was awake and alert. Hearing and vision were adequate for testing purposes. Orientation: The patient was alert and fully oriented.  Appearance: Dressed in appropriate, casual clothing with reasonable grooming and hygiene.  Behavior: The patient was pleasant and appropriate Speech/language: Normal in rate, rhythm, and volume. No difficulties with word finding noticed during conversational speech.  Gait/Posture: The patient's gait appeared normal on ambulation within the clinic.  Movement: Normal, no overt signs of Parkinsonism or movement difficulties.  Social Comportment: Pleasant, appropriate within social norms Mood: "Good" Affect: Mainly neutral Thought process/content: Thought process was logical and goal oriented and he had no difficulties presenting  a detailed personal history and timeline.  Safety: No thoughts of harming self or others noted on direct questioning.  Insight: Christin Bach Cognitive Assessment  10/29/2020  Visuospatial/ Executive (0/5) 4  Naming (0/3) 3  Attention: Read list of digits (0/2) 2  Attention: Read list of letters (0/1) 1  Attention: Serial 7 subtraction starting at 100 (0/3) 1  Language: Repeat phrase (0/2) 1  Language : Fluency (0/1) 0  Abstraction (0/2) 2  Delayed Recall (0/5) 4  Orientation (0/6) 6  Total 24  Adjusted Score (based on education) 24    Test Procedures  Wide Range Achievement Test - 4             Word Reading Neuropsychological Assessment Battery             Attention Module              Memory Module              Visual Discrimination             Design Construction             Oral Production             Naming The Dot Counting Test ACS Word Choice Controlled Oral Word Association (F-A-S) Semantic Fluency (Animals) Trail Making Test A & B Modified [place] Card Sorting Test Patient Health Questionnaire - 9 GAD - 7   Plan  Alexander Rhodes was seen for a psychiatric diagnostic evaluation and neuropsychological testing. He is a pleasant, 45 year old, right-hand dominant married man with a history of severe OSA discovered in 2019 when he realized he could not recall much about a large block of time (~ 8 months). He was also having some difficulties with word finding, cognitive slowness and the like at that time. He estimated that his difficulties are 90% better but he does not feel 100% and he is interested in a second opinion and complete workup to make sure that nothing is being missed. He also thinks it odd that it took him so long to improve. He is screening in the normal to perhaps MCI range considering his education, although he also has a history that is concerning for possible learning difficulties that needs to be taken into account. Testing should be helpful in better  elucidating his difficulties. Full and complete note with impressions, recommendations, and interpretation of test data to follow.   Bettye Boeck Roseanne Reno, PsyD, ABN Clinical Neuropsychologist  Informed Consent  Risks and benefits of the evaluation were discussed with the patient prior to all testing procedures. I conducted a clinical interview and neuropsychological testing (at least two tests) with Alexander Rhodes and Clare Charon, B.S. (Technician) administered additional test procedures. The patient  was able to tolerate the testing procedures and the patient (and/or family if applicable) is likely to benefit from further follow up to receive the diagnosis and treatment recommendations, which will be rendered at the next encounter.

## 2020-10-30 NOTE — Progress Notes (Signed)
NEUROPSYCHOLOGICAL EVALUATION [facility]  Patient Name: Alexander Rhodes MRN: [id number] Date of Birth: 10-Sep-1975 Age: 45 y.o. Education: 18 years  Clinical Impressions  Alexander Rhodes is a 45 y.o., right-hand dominant, married man with a history of severe OSA (now treated with CPAP), idiopathic hypersomnia, and concerns about memory and word finding problems. He first noticed his problems in mid 2019, at which point he realized he could not recall much about the past 8 months of his life. He also appreciated slowed processing speed, taking a long time to formulate thoughts, difficulties recalling names, and other language problems (e.g., typing words backwards, not being able to conjure up images of words that he knew) at that time. He was diagnosed with OSA and started on CPAP and these difficulties began to slowly improve, although he thinks he is only around 90% of his previous level of function. He has undergone MRI of the brain, which was reportedly normal, although I have not been able to personally review the images. He wanted evaluation to make sure nothing is being missed and he does have some family history of dementia.   On neuropsychological testing, Alexander Rhodes performed below expectations for an individual of his ability level on measures of attention and processing speed with an extremely low score on the overall index and primary difficulties on tasks emphasizing processing efficiency, vigilance, selective attention, and alternating attention. There may be some confound from speed accuracy tradeoff. He had some likely related low scores on select measures of executive function. His memory performance was marginally below expectation for him at an index level but the number of low subtest scores is not unusual. He did well on measures that are viewed as most specific for Alzheimer's disease such as visual object confrontation naming and semantic fluency. He reported a mild  level of depressive symptoms, most of which could be related to his OSA (e.g., feeling tired, trouble concentrating).   Alexander Rhodes is thus demonstrating a mild level of neurocognitive dysfunction characterized by difficulties with aspects of attention and diminished memory efficiency. Importantly, he does not have any frank memory storage problem and the extent of these difficulties is mild. I am not concerned about an underlying condition at risk for decline; rather, I think it likely that Alexander Rhodes has ongoing interference related to his severe OSA. The types of difficulties he demonstrated on testing have been reported in the OSA literature and it is also frequently the case that difficulties experienced by these patients do not remit entirely even with appropriate CPAP treatment.    Diagnostic Impressions: Mild neurocognitive disorder  Obstructive sleep apnea on CPAP  Recommendations to be discussed with patient  Your performance and presentation on neuropsychological assessment were consistent with a mild level of difficulty on measures of attention and perhaps also memory. Importantly, your memory problems are not of the type that I expect in something like Alzheimer's disease. These types of difficulties are highly characteristic of the types of problems people often experience with obstructive sleep apnea and I think that is the most likely cause of your difficulties.   Sleep is incredibly important to the brain. Sleep is evolutionarily conserved in mammals and even primitive species such as jellyfish. This means that sleep likely serves an important function, although we do not yet fully understand what that function is. Regardless of neurobiological mechanisms, it has been known for centuries that not getting enough sleep or getting poor quality sleep can result in memory  and thinking problems and this is one of the most common causes of cognitive dysfunction that I encounter in my clinic.  Obstructive sleep apnea clearly interferes with sleep quantity and quality and is an independent risk factor for cognitive impairment amongst other health undesirable outcomes. While more research is needed, there are several studies suggesting that the cognitive and neurobehavioral difficulties (e.g., irritability) of individuals with sleep apnea are only partially improved by CPAP usage, and thus it is possible that your sleep apnea remains cognitively impactful even though you are using your CPAP.   My best advice would be to do what you can to manage your sleep apnea, including regular appointments with sleep medicine, good sleep hygiene, and maintaining a healthy weight. There is some research to suggest that physical activity also has benefits for patients with obstructive sleep apnea, although the mechanism by which that occurs is unclear. OSA patients involved in a regular, predominantly aerobic, exercise program have shown a reduction in disease severity and in daytime sleepiness, as well as an increase in sleep efficiency and in peak oxygen consumption, regardless of weight loss. Exercise can also help you maintain a healthy weight. Therefore, I would suggest that you start exercising at least 30 minutes of moderate exercise 4 times a week although even more may be better.   Consider the following as potentially helpful for maximizing functioning during the day: - Try to plan your day so that you do difficult tasks at peak times of productivity (e.g., if you are most alert in the morning, do challenging or monotonous tasks in the morning to the extent possible).  - Avoid multitasking and try to focus on only one thing at a time - Try to cultivate a mindset of awareness and mindfulness by focusing on the task at hand - Use external aids such as an electronic planner, phone, note pads, sticky notes and the like - Develop organizational systems such as folders for e-mail, folders for necessary  papers, bins for mail, or whatever else helps you organize your life.  - Avoid working under time pressure, when you are more likely to make mistakes  Importantly, I would like to reassure you that none of the test findings or your clinical history makes me very concerned about something like Alzheimer's disease. The frequency of dementia in individuals your age is extremely low. A recent meta analysis found that the global age-standardized prevalence of dementia is approximately 1.19 per 1000 individuals from 3 to 65 years of age, the vast majority of which is in the 33 to 85 age range. To put that into perspective, your odds of dying in a motor vehicle collision are about 1 in 107, from falling the odds are about 1 in 106, and from drowning the odds are about 1 in 1,128 [facility] for [employer], 2019). Simply going off your age with no other information, it is not very likely that your issues are due to early onset dementia. The fact that your test data also does not look like I would expect in Alzheimer's is further reassuring.   There are few things as disruptive to brain functioning as not getting a good night's sleep. Consider trying some of the following sleep hygiene recommendations. They may not work at once and may take effort, but the effort you spend is likely to be rewarded with better sleep eventually:  . Stick to a sleep schedule of the same bedtime and wake time even on the weekends, which can help  to regulate your body's internal clock so that you fall asleep and stay asleep.  . Practice a relaxing bedtime ritual (conducted away from bright lights) which will help separate your sleep from stimulating activities and prepare your body to fall asleep when you go to bed.  . Avoid naps, especially in the afternoon.  . Evaluate your room and create conditions that will promote sleep such as keeping it cool (between 60 - 67 degrees), quiet, and free from any lights. Consider using  blackout curtains, a "white noise" generator, or fan that will help mask any noises that might prevent you from going to sleep or awaken you during the night.  . Sleep on a comfortable mattress and pillows.  . Avoid bright light in the evening and excessive use of portable electronic devices right before bed that may contain light frequencies that can contribute to sleep problems.  . Avoid alcohol, cigarettes, or heavy meals in the evening. If you must eat, consume a light snack 45 minutes before bed.  . Use your bed only for sleep to strengthen the association between your bed and sleep.  . If you can't go to sleep within 30 minutes, go into another room and do something relaxing until you feel tired. Then, come back and try to go to sleep again for 30 minutes and repeat until sleep is achieved.  . Some people find over the counter melatonin to be helpful for sleep, which you could discuss with a pharmacist or prescribing provider.   As you are likely aware, excessive consumption of alcohol (which is typically considered more than 2 drinks a day for men) can be disruptive to brain functioning and is a risk factor for cognitive difficulties.   Smoking is also a risk factor for cerebrovascular disease and other conditions that can contribute to cognitive impairment. As you are likely aware, it would be best to quit. I can recommend the book "[employer] to Quit Smoking," which has helped millions of individuals to quit.   Test Findings  Test scores are summarized in additional documentation associated with this encounter. Test scores are relative to age, gender, and educational history as available and appropriate. There were no concerns about performance validity as all findings fell within normal expectations.   General Intellectual Functioning/Achievement:  Performance on single word reading was in the low average range whereas his performance on the RIST index was at the upper aspect of  the average range. His RIST index was used as a basis of comparison for his cognitive test scores.   Attention and Processing Efficiency: Performance on indicators of processing efficiency and working memory was below expectation for an individual of Alexander Rhodes measured ability, with an extremely low score on the overall index. He had difficulties with digit repetition forward and on timed measures emphasizing processing speed. Visual attentional abilities were somewhat better.  With respect to attention, digit repetition forward was extremely low whereas digit repetition backward was average. On visually mediated indicators involving identifying changes to a series of printed stimulus materials, performance was good with an average score for dot patterns and a low average score for driving scenes.   With respect to processing efficiency, his performance was extremely low on a measure involving concentration and sustained attention while engaging in a letter cancellation task, although there is some evidence of speed accuracy trade off (he had high average accuracy but speed was extremely low. On tasks emphasizing letter counting and mental addition under time  pressure, his performance was unusually low. Alternating attention between letter cancellation and mental addition was extremely low.   Language: Performance on the fundamental ability of visual object confrontation naming was errorless. By contrast, his generation of words in response to the letters F-A-S was extremely low. Generation of words in response to the category prompt "animals" was low average.   Visuospatial Function: Performance was very good on visuospatial and constructional indicators with high average scores on constructional and perceptual indicators.   Learning and Memory: Performance on measures of learning and memory fell below expectations for an individual of his measured ability on the overall index; however, the  number of low subtests is not unusual. He demonstrated a few week scores as opposed to any patterns of performance concerning for frank impairment and there is no indication of storage problems.   In the verbal realm, he learned 7, 8, and 12 words of a 12-item word list followed by 6 words at short delayed recall and 7 words at long delayed recall. His learning score psychometrically was average, whereas short delayed recall was unusually low and delayed recall was low average. His recognition for words from the list versus foils was average. Memory for a short story was average on immediate and delayed recall. Memory for brief daily-living type information was low average on immediate and delayed recall, albeit with very good high average recognition.   In the visual realm, Mr. Xia achieved average scores when learning a series of designs that are difficult to verbally encode, for both immediate and delayed recognition. Yes/no recognition discriminability was high average.   Executive Functions: Alexander Rhodes, newspaper was reasonable, although he did have a few low scores on measures with more of an attentional/processing speed component, which are likely related to his attentional problems. Alternating sequencing of numbers and letters of the alphabet was extremely low. Generation of words in response to the letters F-A-S was extremely low. By contrast, the Executive Function Composite was average on a rule-based categorization procedure emphasizing efficient visual scanning and efficient visual matching. Clock drawing was normal although there was no size difference between hands.   Rating Scale(s): Mr. Matsuura reported mild levels of depressive symptoms, most of which may be due to OSA, and thus this could be a false positive. He denied much in the way of anxiety symptoms.   Alexander Boeck Roseanne Reno, Alexander Rhodes, ABN Clinical Neuropsychologist  Coding and Compliance  Billing below reflects technician time,  my direct face-to-face time with the patient, time spent in test administration, and time spent in professional activities including but not limited to: neuropsychological test interpretation, integration of neuropsychological test data with clinical history, report preparation, treatment planning, care coordination, and review of diagnostically pertinent medical history or studies.   Services associated with this encounter: Clinical Interview [phone]) plus 175 minutes (96132/96133; Neuropsychological Evaluation by Professional)  21 minutes (96136/96137; Test Administration by Professional) 160 minutes (96138/96139; Neuropsychological Testing by Technician)

## 2020-11-04 DIAGNOSIS — M546 Pain in thoracic spine: Secondary | ICD-10-CM | POA: Diagnosis not present

## 2020-11-05 ENCOUNTER — Other Ambulatory Visit: Payer: Self-pay

## 2020-11-05 ENCOUNTER — Encounter: Payer: Self-pay | Admitting: Counselor

## 2020-11-05 ENCOUNTER — Ambulatory Visit (INDEPENDENT_AMBULATORY_CARE_PROVIDER_SITE_OTHER): Payer: BC Managed Care – PPO | Admitting: Counselor

## 2020-11-05 DIAGNOSIS — G3184 Mild cognitive impairment, so stated: Secondary | ICD-10-CM | POA: Diagnosis not present

## 2020-11-05 DIAGNOSIS — F0670 Mild neurocognitive disorder due to known physiological condition without behavioral disturbance: Secondary | ICD-10-CM

## 2020-11-05 NOTE — Patient Instructions (Signed)
Your performance and presentation on neuropsychological assessment were consistent with a mild level of difficulty on measures of attention and perhaps also memory. Importantly, your memory problems are not of the type that I expect in something like Alzheimer's disease. These types of difficulties are highly characteristic of the types of problems people often experience with obstructive sleep apnea and I think that is the most likely cause of your difficulties.   Sleep is incredibly important to the brain. Sleep is evolutionarily conserved in mammals and even primitive species such as jellyfish. This means that sleep likely serves an important function, although we do not yet fully understand what that function is. Regardless of neurobiological mechanisms, it has been known for centuries that not getting enough sleep or getting poor quality sleep can result in memory and thinking problems and this is one of the most common causes of cognitive dysfunction that I encounter in my clinic. Obstructive sleep apnea clearly interferes with sleep quantity and quality and is an independent risk factor for cognitive impairment amongst other health undesirable outcomes. While more research is needed, there are several studies suggesting that the cognitive and neurobehavioral difficulties (e.g., irritability) of individuals with sleep apnea are only partially improved by CPAP usage, and thus it is possible that your sleep apnea remains cognitively impactful even though you are using your CPAP.   My best advice would be to do what you can to manage your sleep apnea, including regular appointments with sleep medicine, good sleep hygiene, and maintaining a healthy weight. There is some research to suggest that physical activity also has benefits for patients with obstructive sleep apnea, although the mechanism by which that occurs is unclear. OSA patients involved in a regular, predominantly aerobic, exercise program have  shown a reduction in disease severity and in daytime sleepiness, as well as an increase in sleep efficiency and in peak oxygen consumption, regardless of weight loss. Exercise can also help you maintain a healthy weight. Therefore, I would suggest that you start exercising at least 30 minutes of moderate exercise 4 times a week although even more may be better.   Consider the following as potentially helpful for maximizing functioning during the day: - Try to plan your day so that you do difficult tasks at peak times of productivity (e.g., if you are most alert in the morning, do challenging or monotonous tasks in the morning to the extent possible).  - Avoid multitasking and try to focus on only one thing at a time - Try to cultivate a mindset of awareness and mindfulness by focusing on the task at hand - Use external aids such as an electronic planner, phone, note pads, sticky notes and the like - Develop organizational systems such as folders for e-mail, folders for necessary papers, bins for mail, or whatever else helps you organize your life.  - Avoid working under time pressure, when you are more likely to make mistakes  Importantly, I would like to reassure you that none of the test findings or your clinical history makes me very concerned about something like Alzheimer's disease. The frequency of dementia in individuals your age is extremely low. A recent meta analysis found that the global age-standardized prevalence of dementia is approximately 1.19 per 1000 individuals from 61 to 27 years of age, the vast majority of which is in the 31 to 62 age range. To put that into perspective, your odds of dying in a motor vehicle collision are about 1 in 107, from falling the  odds are about 1 in 106, and from drowning the odds are about 1 in 1,128 [facility] for [employer], 2019). Simply going off your age with no other information, it is not very likely that your issues are due to early  onset dementia. The fact that your test data also does not look like I would expect in Alzheimer's is further reassuring.   There are few things as disruptive to brain functioning as not getting a good night's sleep. Consider trying some of the following sleep hygiene recommendations. They may not work at once and may take effort, but the effort you spend is likely to be rewarded with better sleep eventually:   Stick to a sleep schedule of the same bedtime and wake time even on the weekends, which can help to regulate your body's internal clock so that you fall asleep and stay asleep.   Practice a relaxing bedtime ritual (conducted away from bright lights) which will help separate your sleep from stimulating activities and prepare your body to fall asleep when you go to bed.   Avoid naps, especially in the afternoon.   Evaluate your room and create conditions that will promote sleep such as keeping it cool (between 60 - 67 degrees), quiet, and free from any lights. Consider using blackout curtains, a "white noise" generator, or fan that will help mask any noises that might prevent you from going to sleep or awaken you during the night.   Sleep on a comfortable mattress and pillows.   Avoid bright light in the evening and excessive use of portable electronic devices right before bed that may contain light frequencies that can contribute to sleep problems.   Avoid alcohol, cigarettes, or heavy meals in the evening. If you must eat, consume a light snack 45 minutes before bed.   Use your bed only for sleep to strengthen the association between your bed and sleep.   If you can't go to sleep within 30 minutes, go into another room and do something relaxing until you feel tired. Then, come back and try to go to sleep again for 30 minutes and repeat until sleep is achieved.   Some people find over the counter melatonin to be helpful for sleep, which you could discuss with a pharmacist or  prescribing provider.   As you are likely aware, excessive consumption of alcohol (which is typically considered more than 2 drinks a day for men) can be disruptive to brain functioning and is a risk factor for cognitive difficulties.   Smoking is also a risk factor for cerebrovascular disease and other conditions that can contribute to cognitive impairment. As you are likely aware, it would be best to quit. I can recommend the book "[employer] to Quit Smoking," which has helped millions of individuals to quit.

## 2020-11-05 NOTE — Progress Notes (Signed)
   NEUROPSYCHOLOGY FEEDBACK NOTE [facility]  Feedback Note: I met with Alexander Rhodes to review the findings resulting from his neuropsychological evaluation. Since the last appointment, he has been about the same. Time was spent reviewing the impressions and recommendations that are detailed in the evaluation report. We discussed impression of mild cognitive problems likely related to sleep apnea, as reflected in the patient instructions. I reinforced the importance of healthy lifestyle behaviors including good sleep hygiene, not smoking, not drinking excessively. Also recommended that he increase exercise. He has a routine that he does with stretching 1x a week, upper body 1x a week, and core 1x a week. Would be better to integrate some aerobic activity and possible increase frequency. I took time to explain the findings and answer all the patient's questions. I encouraged [name] to contact me should he have any further questions or if further follow up is desired.   Current Medications and Medical History   No current outpatient medications on file.   No current facility-administered medications for this visit.    There are no problems to display for this patient.  I took the opportunity to examine [name] MRI brain, which he brought on disc to the appointment today. The ventricles were normal in size and configuration. Brain volume and morphology appear normal for age. There was trace leukoaraiosis in the posterior horns but it is quite minimal and is likely insufficient to exert much in the way of cognitive effect in my opinion. This study is unremarkable and reassuring.   Mental Status and Behavioral Observations  Alexander Rhodes presented on time to the present encounter and was alert and generally oriented. Speech was normal in rate, rhythm, volume, and prosody. Self-reported mood was "good" and affect was euthymic. Thought process was logical and goal oriented and  thought content was appropriate to the topics discussed. There were no safety concerns identified at today's encounter, such as thoughts of harming self or others.   Plan  Feedback provided regarding the patient's neuropsychological evaluation. He was relieved that it does not appear there is an underlying condition. [name] was encouraged to contact me if any questions arise or if further follow up is desired.   [name], PsyD, ABN Clinical Neuropsychologist  Service(s) Provided at This Encounter: 57 minutes [phone]; Psychotherapy with patient/family)

## 2020-11-14 DIAGNOSIS — M4724 Other spondylosis with radiculopathy, thoracic region: Secondary | ICD-10-CM | POA: Diagnosis not present

## 2020-11-14 DIAGNOSIS — M5414 Radiculopathy, thoracic region: Secondary | ICD-10-CM | POA: Diagnosis not present

## 2020-11-21 DIAGNOSIS — M5414 Radiculopathy, thoracic region: Secondary | ICD-10-CM | POA: Diagnosis not present

## 2020-11-21 DIAGNOSIS — M4724 Other spondylosis with radiculopathy, thoracic region: Secondary | ICD-10-CM | POA: Diagnosis not present

## 2020-11-26 DIAGNOSIS — D2261 Melanocytic nevi of right upper limb, including shoulder: Secondary | ICD-10-CM | POA: Diagnosis not present

## 2020-11-26 DIAGNOSIS — D225 Melanocytic nevi of trunk: Secondary | ICD-10-CM | POA: Diagnosis not present

## 2020-11-26 DIAGNOSIS — M4724 Other spondylosis with radiculopathy, thoracic region: Secondary | ICD-10-CM | POA: Diagnosis not present

## 2020-11-26 DIAGNOSIS — M5414 Radiculopathy, thoracic region: Secondary | ICD-10-CM | POA: Diagnosis not present

## 2020-11-26 DIAGNOSIS — L821 Other seborrheic keratosis: Secondary | ICD-10-CM | POA: Diagnosis not present

## 2020-11-26 DIAGNOSIS — D2271 Melanocytic nevi of right lower limb, including hip: Secondary | ICD-10-CM | POA: Diagnosis not present

## 2020-12-02 DIAGNOSIS — M5414 Radiculopathy, thoracic region: Secondary | ICD-10-CM | POA: Diagnosis not present

## 2020-12-02 DIAGNOSIS — M4724 Other spondylosis with radiculopathy, thoracic region: Secondary | ICD-10-CM | POA: Diagnosis not present

## 2020-12-18 DIAGNOSIS — M5414 Radiculopathy, thoracic region: Secondary | ICD-10-CM | POA: Diagnosis not present

## 2020-12-18 DIAGNOSIS — M4724 Other spondylosis with radiculopathy, thoracic region: Secondary | ICD-10-CM | POA: Diagnosis not present

## 2020-12-23 DIAGNOSIS — M4724 Other spondylosis with radiculopathy, thoracic region: Secondary | ICD-10-CM | POA: Diagnosis not present

## 2020-12-23 DIAGNOSIS — M5414 Radiculopathy, thoracic region: Secondary | ICD-10-CM | POA: Diagnosis not present

## 2020-12-31 DIAGNOSIS — G4733 Obstructive sleep apnea (adult) (pediatric): Secondary | ICD-10-CM | POA: Diagnosis not present

## 2021-01-03 DIAGNOSIS — M4724 Other spondylosis with radiculopathy, thoracic region: Secondary | ICD-10-CM | POA: Diagnosis not present

## 2021-01-03 DIAGNOSIS — M5414 Radiculopathy, thoracic region: Secondary | ICD-10-CM | POA: Diagnosis not present

## 2021-01-06 DIAGNOSIS — M5414 Radiculopathy, thoracic region: Secondary | ICD-10-CM | POA: Diagnosis not present

## 2021-01-06 DIAGNOSIS — M4724 Other spondylosis with radiculopathy, thoracic region: Secondary | ICD-10-CM | POA: Diagnosis not present

## 2021-03-12 DIAGNOSIS — G4712 Idiopathic hypersomnia without long sleep time: Secondary | ICD-10-CM | POA: Diagnosis not present

## 2021-03-31 DIAGNOSIS — G4733 Obstructive sleep apnea (adult) (pediatric): Secondary | ICD-10-CM | POA: Diagnosis not present

## 2021-06-30 DIAGNOSIS — G4733 Obstructive sleep apnea (adult) (pediatric): Secondary | ICD-10-CM | POA: Diagnosis not present

## 2021-07-02 DIAGNOSIS — G4733 Obstructive sleep apnea (adult) (pediatric): Secondary | ICD-10-CM | POA: Diagnosis not present

## 2022-01-26 IMAGING — CT CT RENAL STONE PROTOCOL
2 of 4 series · 16 of 46 positions shown, 18 images · non-contrast
Comparison: None.

CLINICAL DATA: Left-sided flank pain

EXAM:
CT ABDOMEN AND PELVIS WITHOUT CONTRAST
TECHNIQUE: Multidetector CT imaging of the abdomen and pelvis was performed
following the standard protocol without IV contrast.

[Series 2: axial st · axial · 0.74mm/px · z∈[+884,+1259]mm · 13 of 85 slices shown, 15 images]
[im 5/85  soft-tissue]
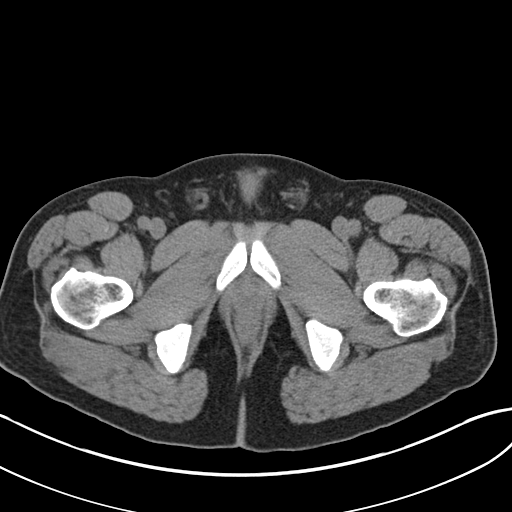
[im 5/85  bone]
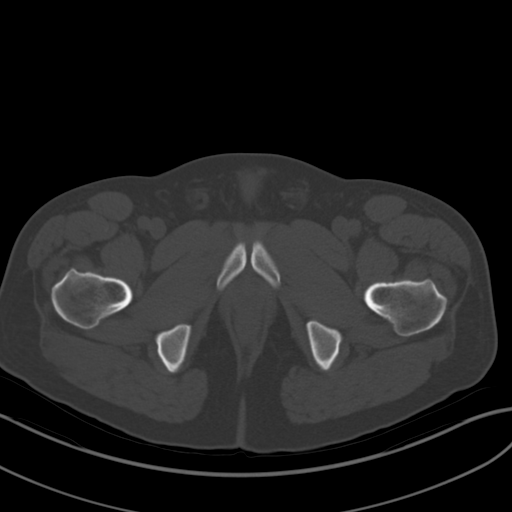
[im 10/85  soft-tissue]
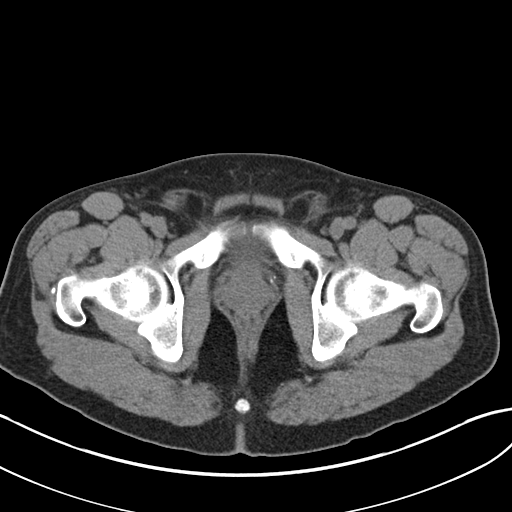
[im 20/85  soft-tissue]
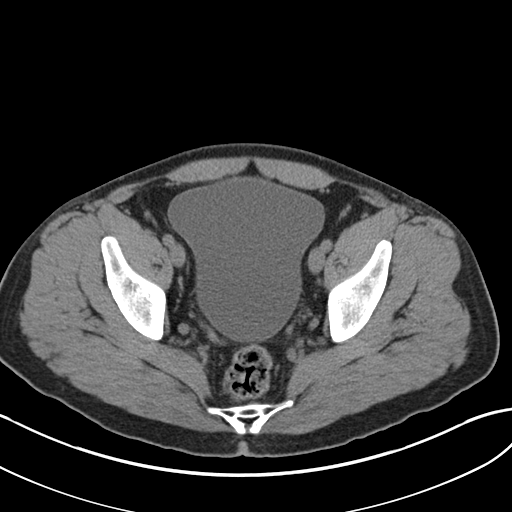
[im 25/85  soft-tissue]
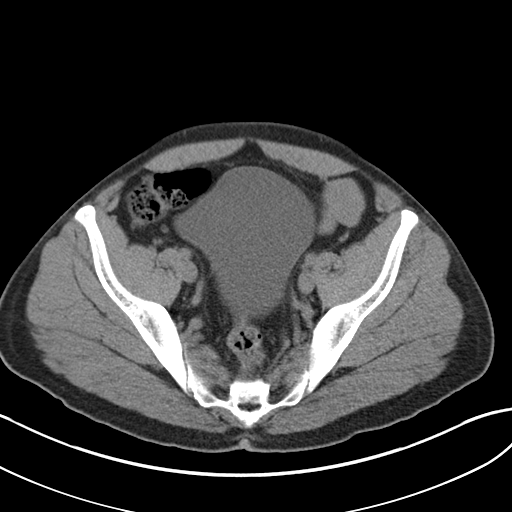
[im 30/85  soft-tissue]
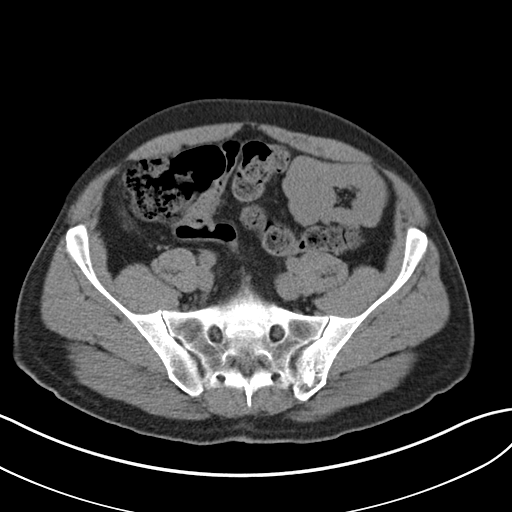
[im 35/85  soft-tissue]
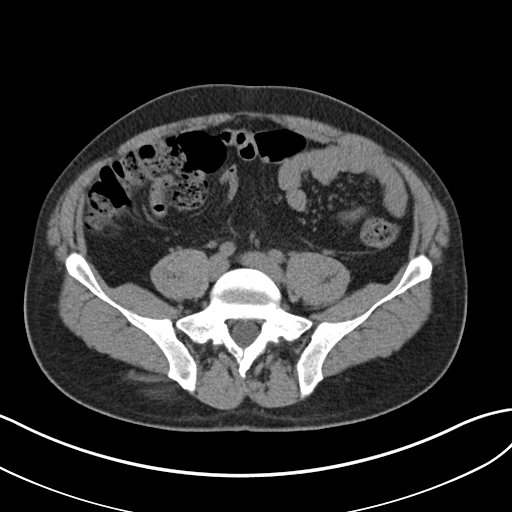
[im 45/85  soft-tissue]
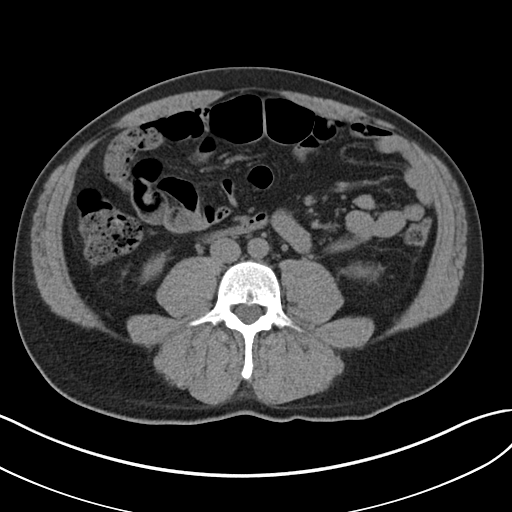
[im 50/85  soft-tissue]
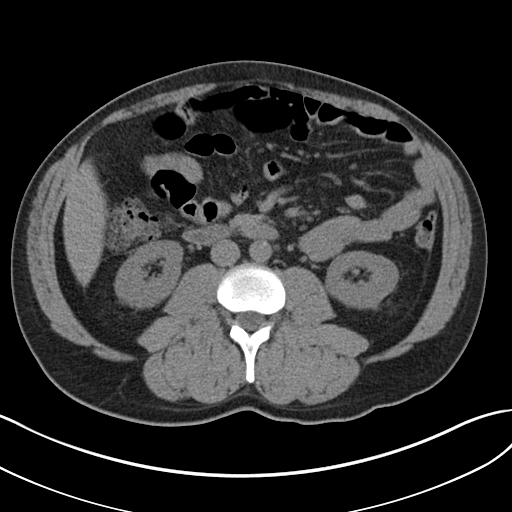
[im 55/85  soft-tissue]
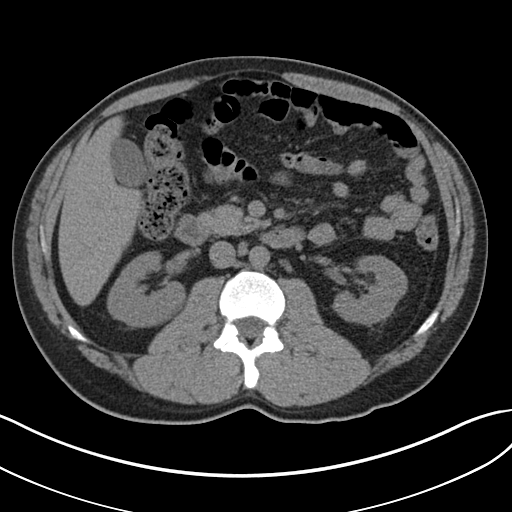
[im 55/85  bone]
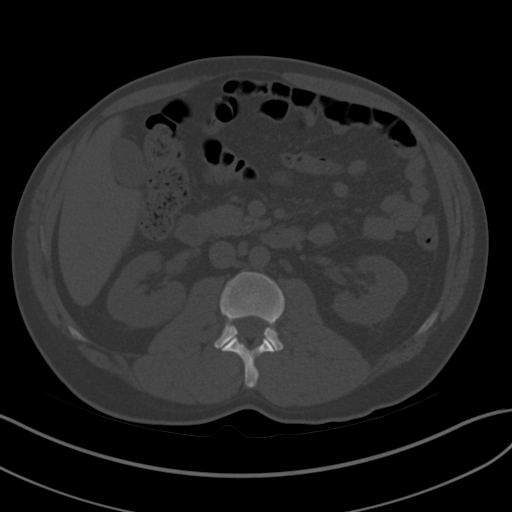
[im 60/85  soft-tissue]
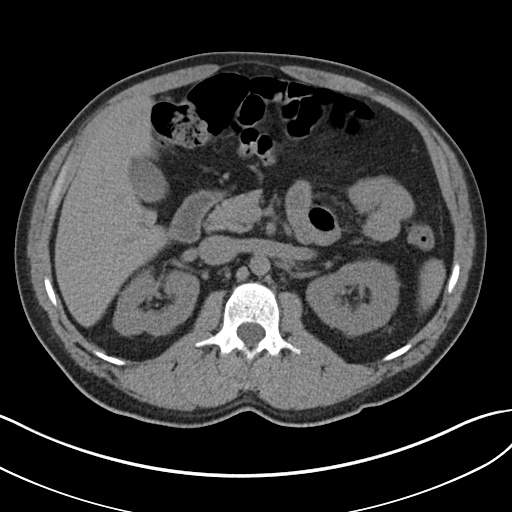
[im 65/85  soft-tissue]
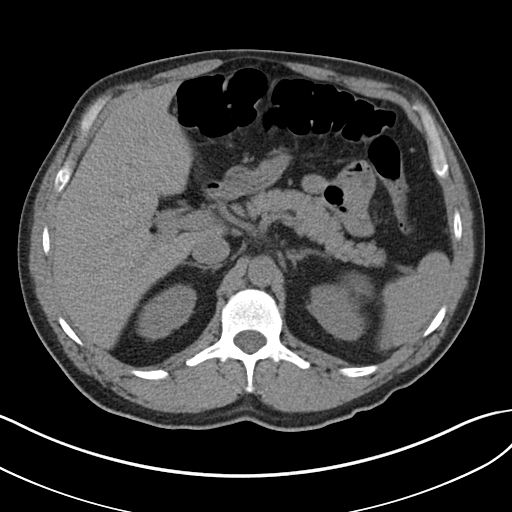
[im 75/85  soft-tissue]
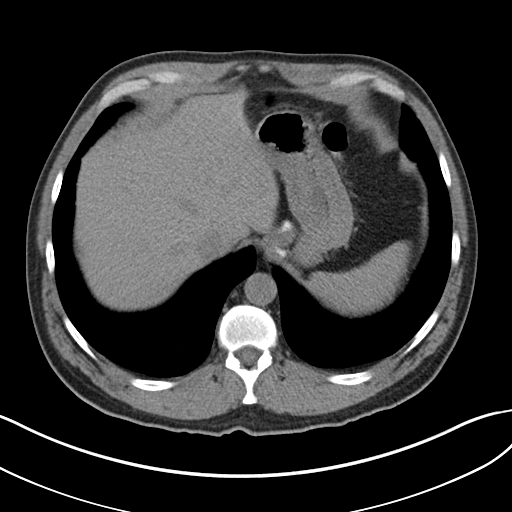
[im 80/85  soft-tissue]
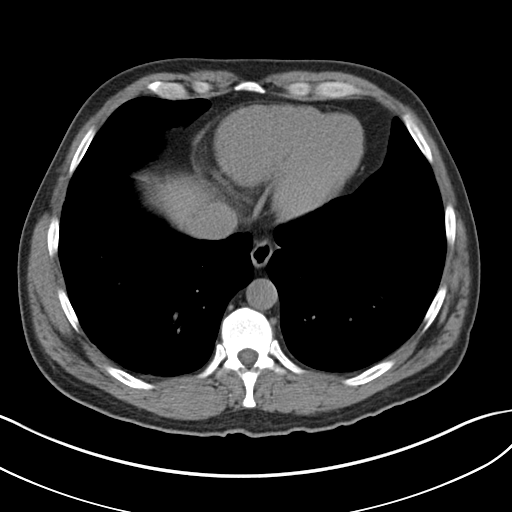

[Series 5: coronal · coronal · 0.75mm/px · 3 of 145 slices shown]
[im 49/145  soft-tissue]
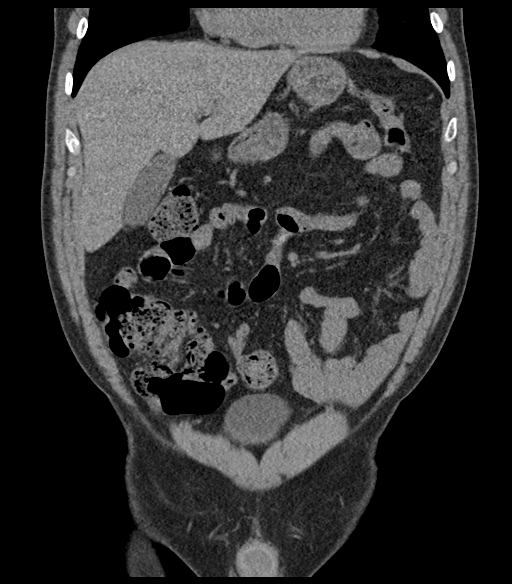
[im 65/145  soft-tissue]
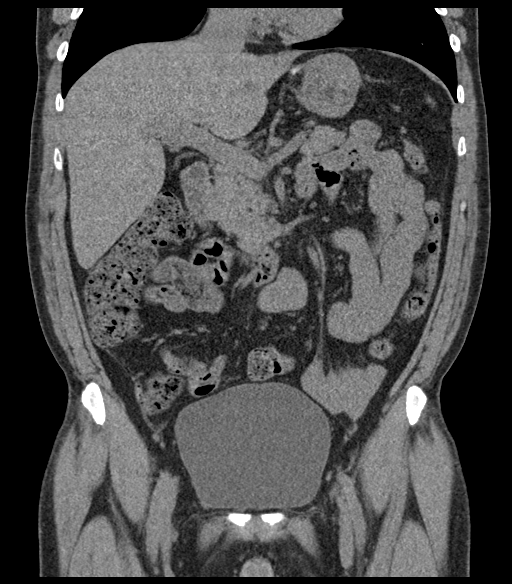
[im 81/145  soft-tissue]
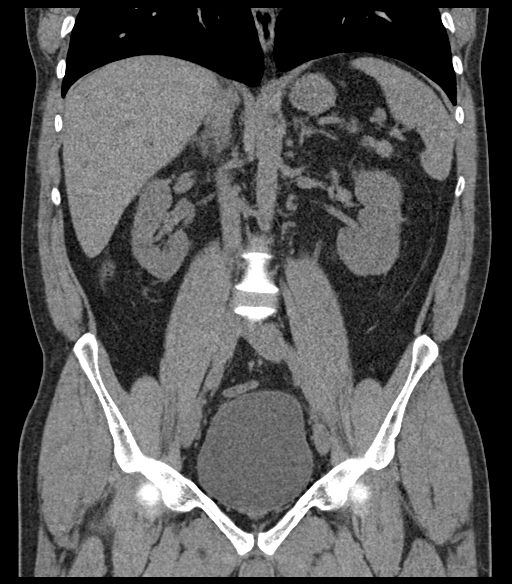

[16 of 46 positions shown; findings below may reference images not displayed]

FINDINGS: Lower chest: The visualized heart size within normal limits. No
pericardial fluid/thickening.

No hiatal hernia.

The visualized portions of the lungs are clear.

Hepatobiliary: Although limited due to the lack of intravenous
contrast, normal in appearance without gross focal abnormality. No
evidence of calcified gallstones or biliary ductal dilatation.

Pancreas:  Unremarkable.  No surrounding inflammatory changes.

Spleen: Normal in size. Although limited due to the lack of
intravenous contrast, normal in appearance.

Adrenals/Urinary Tract: Both adrenal glands appear normal. The
kidneys and collecting system appear normal without evidence of
urinary tract calculus or hydronephrosis. Bladder is fluid-filled
and mildly dilated.

Stomach/Bowel: The stomach, small bowel, and colon are normal in
appearance. No inflammatory changes or obstructive findings. There
is a moderate amount of colonic stool present throughout. Appendix
is normal.

Vascular/Lymphatic: There are no enlarged abdominal or pelvic lymph
nodes. No significant gross vascular findings are present given the
lack of intravenous contrast.

Reproductive: The prostate is unremarkable.

Other: No evidence of abdominal wall mass or hernia.

Musculoskeletal: No acute or significant osseous findings.
IMPRESSION: No renal or collecting system calculi.

No other acute intra-abdominal or pelvic pathology to explain the
patient's symptoms.
